# Patient Record
Sex: Male | Born: 1995 | Race: Black or African American | Hispanic: No | Marital: Single | Smoking: Never smoker
Health system: Southern US, Community
[De-identification: ages and names within clinical notes are randomized; demographics above are authoritative.]

---
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
## 2016-02-25 ENCOUNTER — Encounter (HOSPITAL_COMMUNITY): Payer: Self-pay | Admitting: *Deleted

## 2016-02-25 DIAGNOSIS — R1110 Vomiting, unspecified: Secondary | ICD-10-CM | POA: Diagnosis not present

## 2016-02-25 DIAGNOSIS — R05 Cough: Secondary | ICD-10-CM | POA: Diagnosis present

## 2016-02-25 DIAGNOSIS — J111 Influenza due to unidentified influenza virus with other respiratory manifestations: Secondary | ICD-10-CM | POA: Diagnosis not present

## 2016-02-25 DIAGNOSIS — R197 Diarrhea, unspecified: Secondary | ICD-10-CM | POA: Diagnosis not present

## 2016-02-25 LAB — URINALYSIS, ROUTINE W REFLEX MICROSCOPIC
BILIRUBIN URINE: NEGATIVE
GLUCOSE, UA: NEGATIVE mg/dL
HGB URINE DIPSTICK: NEGATIVE
Ketones, ur: 15 mg/dL — AB
Leukocytes, UA: NEGATIVE
Nitrite: NEGATIVE
Protein, ur: NEGATIVE mg/dL
SPECIFIC GRAVITY, URINE: 1.036 — AB (ref 1.005–1.030)
pH: 7 (ref 5.0–8.0)

## 2016-02-25 LAB — CBC
HCT: 46.1 % (ref 39.0–52.0)
HEMOGLOBIN: 16 g/dL (ref 13.0–17.0)
MCH: 30.4 pg (ref 26.0–34.0)
MCHC: 34.7 g/dL (ref 30.0–36.0)
MCV: 87.5 fL (ref 78.0–100.0)
Platelets: 197 10*3/uL (ref 150–400)
RBC: 5.27 MIL/uL (ref 4.22–5.81)
RDW: 12 % (ref 11.5–15.5)
WBC: 5 10*3/uL (ref 4.0–10.5)

## 2016-02-25 LAB — COMPREHENSIVE METABOLIC PANEL
ALBUMIN: 3.7 g/dL (ref 3.5–5.0)
ALK PHOS: 63 U/L (ref 38–126)
ALT: 13 U/L — ABNORMAL LOW (ref 17–63)
ANION GAP: 12 (ref 5–15)
AST: 22 U/L (ref 15–41)
BILIRUBIN TOTAL: 0.5 mg/dL (ref 0.3–1.2)
BUN: 11 mg/dL (ref 6–20)
CALCIUM: 9 mg/dL (ref 8.9–10.3)
CO2: 26 mmol/L (ref 22–32)
CREATININE: 1.23 mg/dL (ref 0.61–1.24)
Chloride: 100 mmol/L — ABNORMAL LOW (ref 101–111)
GFR calc Af Amer: >60 mL/min (ref >60)
GFR calc non Af Amer: >60 mL/min (ref >60)
GLUCOSE: 109 mg/dL — AB (ref 65–99)
Potassium: 3.8 mmol/L (ref 3.5–5.1)
Sodium: 138 mmol/L (ref 135–145)
TOTAL PROTEIN: 8 g/dL (ref 6.5–8.1)

## 2016-02-25 LAB — LIPASE, BLOOD: Lipase: 26 U/L (ref 11–51)

## 2016-02-25 NOTE — ED Notes (Signed)
Pt c/o generalized body aches, headache, cough emesis and diarrhea since Sunday night.

## 2016-02-26 ENCOUNTER — Emergency Department (HOSPITAL_COMMUNITY)
Admission: EM | Admit: 2016-02-26 | Discharge: 2016-02-26 | Disposition: A | Discharge status: Home / Self Care | Payer: Medicaid Other | Attending: Emergency Medicine | Admitting: Emergency Medicine

## 2016-02-26 ENCOUNTER — Emergency Department (HOSPITAL_COMMUNITY): Payer: Medicaid Other

## 2016-02-26 DIAGNOSIS — R69 Illness, unspecified: Secondary | ICD-10-CM

## 2016-02-26 DIAGNOSIS — J111 Influenza due to unidentified influenza virus with other respiratory manifestations: Secondary | ICD-10-CM

## 2016-02-26 MED ORDER — ONDANSETRON 4 MG PO TBDP: 4.0000 mg | ORAL_TABLET | Freq: Three times a day (TID) | ORAL | Status: AC | PRN

## 2016-02-26 MED ORDER — ONDANSETRON 4 MG PO TBDP
4.0000 mg | ORAL_TABLET | Freq: Once | ORAL | Status: AC
  Administered 2016-02-26: 4 mg via ORAL
  Filled 2016-02-26: qty 1

## 2016-02-26 MED ORDER — NAPROXEN 500 MG PO TABS: 500.0000 mg | ORAL_TABLET | Freq: Two times a day (BID) | ORAL | Status: AC

## 2016-02-26 NOTE — ED Provider Notes (Signed)
CSN: [id number]     Arrival date & time 02/25/16  1958 History  By signing my name below, I, Frank Burgess, attest that this documentation has been prepared under the direction and in the presence of Alvira MondayErin Dailan Pfalzgraf, MD Electronically Signed: Charlean Merlohini Burgess, ED Scribe 02/26/2016 at 6:02 PM.  Chief Complaint  Patient presents with  . Headache  . Cough  . Generalized Body Aches  . Emesis   The history is provided by the patient. No language interpreter was used.    HPI Comments: Frank Burgess is a 20 y.o. male who presents to the Emergency Department complaining of generalized body aches, HA, cough, emesis x2/day, stuffy nose, sore throat, chills, and diarrhea x3/day since 4 days ago. Pt denies any recorded fever, however he "feels hot".  Pt denies any SOB, abdominal pain, or neck stiffness. Pt has not taken any OTC medication. Pt has no sick contacts. Pt does not use any IV drugs and has no concerns of exposure to HIV.   History reviewed. No pertinent past medical history. History reviewed. No pertinent past surgical history. No family history on file. Social History  Substance Use Topics  . Smoking status: Never Smoker   . Smokeless tobacco: None  . Alcohol Use: No    Review of Systems  Constitutional: Positive for fever and chills.  HENT: Positive for congestion and sore throat.   Eyes: Negative for visual disturbance.  Respiratory: Positive for cough. Negative for shortness of breath.   Cardiovascular: Negative for chest pain.  Gastrointestinal: Positive for vomiting and diarrhea. Negative for abdominal pain.  Genitourinary: Negative for difficulty urinating.  Musculoskeletal: Positive for myalgias. Negative for back pain, neck pain and neck stiffness.  Skin: Negative for rash.  Neurological: Positive for headaches. Negative for syncope.  All other systems reviewed and are negative.   Allergies  Peanut oil and Shellfish allergy  Home Medications   Prior to  Admission medications   Medication Sig Start Date End Date Taking? Authorizing Provider  naproxen (NAPROSYN) 500 MG tablet Take 1 tablet (500 mg total) by mouth 2 (two) times daily. 02/26/16   Alvira MondayErin Tyshauna Finkbiner, MD  ondansetron (ZOFRAN ODT) 4 MG disintegrating tablet Take 1 tablet (4 mg total) by mouth every 8 (eight) hours as needed for nausea or vomiting. 02/26/16   Alvira MondayErin Micheal Murad, MD  ondansetron (ZOFRAN) 4 MG tablet Take 1 tablet (4 mg total) by mouth every 6 (six) hours. Patient not taking: Reported on 02/26/2016 12/25/15   Stevi Barrett, PA-C   BP 124/81 mmHg  Pulse 76  Temp(Src) 98.6 F (37 C) (Oral)  Resp 16  SpO2 99% Physical Exam  Constitutional: He is oriented to person, place, and time. He appears well-developed and well-nourished. No distress.  HENT:  Head: Normocephalic and atraumatic.  Mouth/Throat: Oropharynx is clear and moist. No oropharyngeal exudate.  No sign of ear infection.  Eyes: Conjunctivae and EOM are normal. Pupils are equal, round, and reactive to light.  Neck: Normal range of motion. No Brudzinski's sign and no Kernig's sign noted.  Cardiovascular: Normal rate, regular rhythm, normal heart sounds and intact distal pulses.  Exam reveals no gallop and no friction rub.   No murmur heard. Pulmonary/Chest: Effort normal and breath sounds normal. No respiratory distress. He has no wheezes. He has no rales.  Abdominal: Soft. He exhibits no distension. There is no tenderness. There is no guarding.  Musculoskeletal: Normal range of motion. He exhibits no edema.  -krasinkies.   Neurological: He is alert and oriented  to person, place, and time.  Skin: Skin is warm and dry. He is not diaphoretic.  Nursing note and vitals reviewed.   ED Course  Procedures  DIAGNOSTIC STUDIES: Oxygen Saturation is 99% on RA, normal by my interpretation.    COORDINATION OF CARE: 1:46 AM-Discussed treatment plan which includes Zofran, DG chest, UA, and blood work, with pt at bedside and  pt agreed to plan.   Labs Review Labs Reviewed  COMPREHENSIVE METABOLIC PANEL - Abnormal; Notable for the following:    Chloride 100 (*)    Glucose, Bld 109 (*)    ALT 13 (*)    All other components within normal limits  URINALYSIS, ROUTINE W REFLEX MICROSCOPIC (NOT AT [facility]) - Abnormal; Notable for the following:    Color, Urine AMBER (*)    Specific Gravity, Urine 1.036 (*)    Ketones, ur 15 (*)    All other components within normal limits  LIPASE, BLOOD  CBC    Imaging Review Dg Chest 2 View  02/26/2016  CLINICAL DATA:  20 year old male with cough and congestion EXAM: CHEST  2 VIEW COMPARISON:  None. FINDINGS: The heart size and mediastinal contours are within normal limits. Both lungs are clear. The visualized skeletal structures are unremarkable. IMPRESSION: No active cardiopulmonary disease. Electronically Signed   By: Elgie Collard M.D.   On: 02/26/2016 01:34   I have personally reviewed and evaluated these images and lab results as part of my medical decision-making.   EKG Interpretation None      MDM   Final diagnoses:  Influenza-like illness   20yo male with no significant medical history presents with concern for generalized body aches, headache, cough, emesis, congestion, sore throat, chills, diarrhea.  No sign of pneumonia on CXR.  No meningeal signs.  No signs of PTA/RPA/epiglotitits. Abd exam WNL.  Likely viral syndrome, possible influenza. Pt appropriate for outpt treatment, hydration, supportive care. Given rx for zofran. Patient discharged in stable condition with understanding of reasons to return.   I personally performed the services described in this documentation, which was scribed in my presence. The recorded information has been reviewed and is accurate.      Alvira Monday, MD 02/26/16 [phone]

## 2016-02-26 NOTE — ED Notes (Signed)
Patient transported to X-ray 

## 2016-02-26 NOTE — ED Notes (Signed)
Pt departed in NAD.  

## 2017-06-27 IMAGING — CR DG CHEST 2V
2 series · 2 of 2 positions shown · non-contrast
Comparison: None.

CLINICAL DATA: 19-year-old male with cough and congestion

EXAM:
CHEST  2 VIEW

[chest pa]
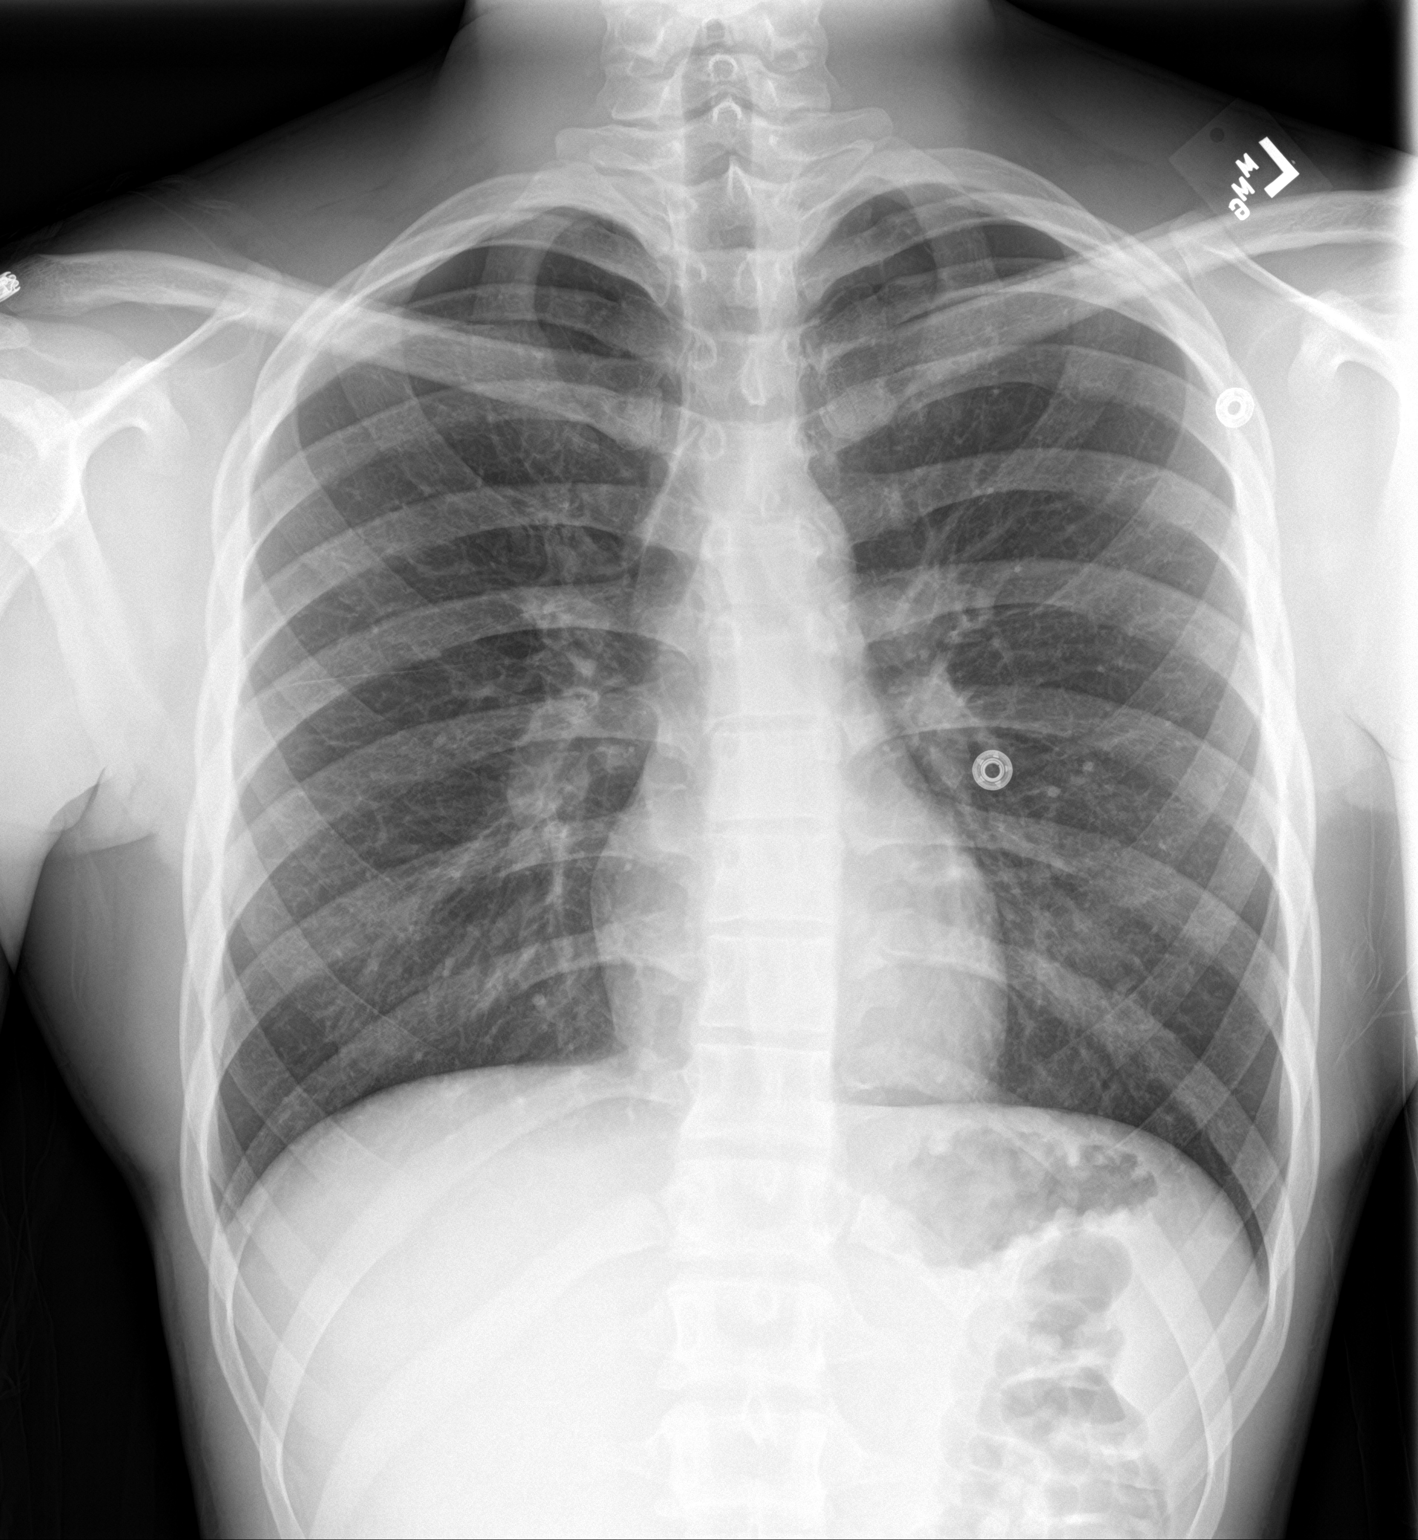

[chest lat]
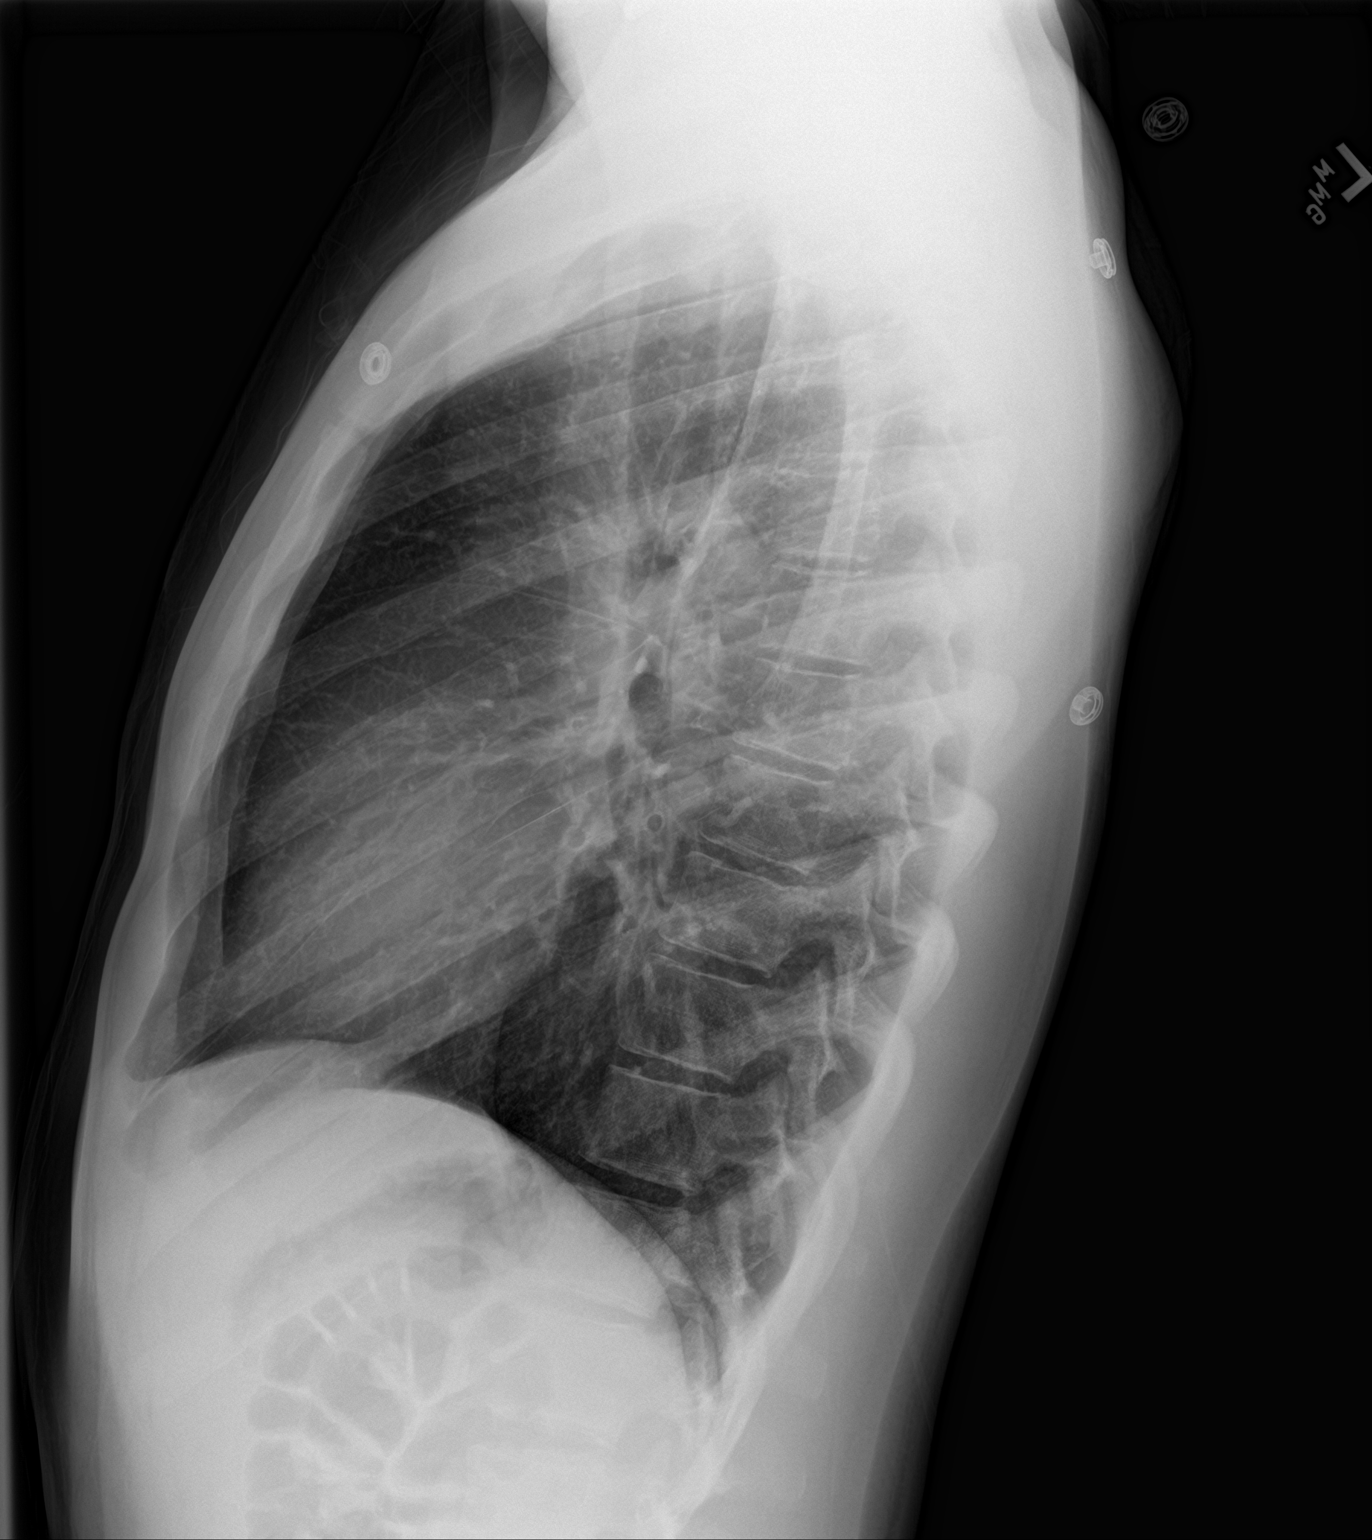

[2 of 2 positions shown; findings below may reference images not displayed]

FINDINGS: The heart size and mediastinal contours are within normal limits.
Both lungs are clear. The visualized skeletal structures are
unremarkable.
IMPRESSION: No active cardiopulmonary disease.
